# Patient Record
Sex: Male | Born: 1980 | Race: White | Hispanic: No | Marital: Married | Smoking: Current every day smoker
Health system: Southern US, Community
[De-identification: ages and names within clinical notes are randomized; demographics above are authoritative.]

## PROBLEM LIST (undated history)

## (undated) DIAGNOSIS — F209 Schizophrenia, unspecified: Secondary | ICD-10-CM

## (undated) DIAGNOSIS — R569 Unspecified convulsions: Secondary | ICD-10-CM

## (undated) DIAGNOSIS — F419 Anxiety disorder, unspecified: Secondary | ICD-10-CM

## (undated) DIAGNOSIS — F319 Bipolar disorder, unspecified: Secondary | ICD-10-CM

---

## 2005-02-13 ENCOUNTER — Emergency Department: Payer: Self-pay | Admitting: Emergency Medicine

## 2005-03-31 ENCOUNTER — Emergency Department: Payer: Self-pay | Admitting: Emergency Medicine

## 2005-09-26 ENCOUNTER — Emergency Department: Payer: Self-pay | Admitting: Emergency Medicine

## 2005-12-28 ENCOUNTER — Emergency Department: Payer: Self-pay | Admitting: Emergency Medicine

## 2006-05-28 ENCOUNTER — Emergency Department: Payer: Self-pay | Admitting: Emergency Medicine

## 2006-08-09 ENCOUNTER — Emergency Department: Payer: Self-pay | Admitting: Emergency Medicine

## 2007-11-14 ENCOUNTER — Emergency Department: Payer: Self-pay | Admitting: Emergency Medicine

## 2008-09-09 ENCOUNTER — Encounter: Payer: Self-pay | Admitting: Family Medicine

## 2008-09-22 ENCOUNTER — Encounter: Payer: Self-pay | Admitting: Family Medicine

## 2008-10-23 ENCOUNTER — Encounter: Payer: Self-pay | Admitting: Family Medicine

## 2008-11-23 ENCOUNTER — Encounter: Payer: Self-pay | Admitting: Family Medicine

## 2009-10-04 ENCOUNTER — Emergency Department: Payer: Self-pay | Admitting: Unknown Physician Specialty

## 2009-11-12 ENCOUNTER — Emergency Department: Payer: Self-pay | Admitting: Emergency Medicine

## 2012-03-09 IMAGING — CR DG CHEST 2V
1 series · 2 of 2 positions shown · non-contrast
Comparison: none

REASON FOR EXAM: difficulty breathing
COMMENTS:   May transport without cardiac monitor

PROCEDURE:     DXR - DXR CHEST PA (OR AP) AND LATERAL  - October 04, 2009 [DATE]
RESULT:     Comparison is made to a prior exam of 05/28/2006.
The lung fields are clear. The heart, mediastinal and osseous structures
show no significant abnormalities.

[Series 1: view not recorded · 0.17mm/px · 2 of 2 slices shown]
[im 1/2]
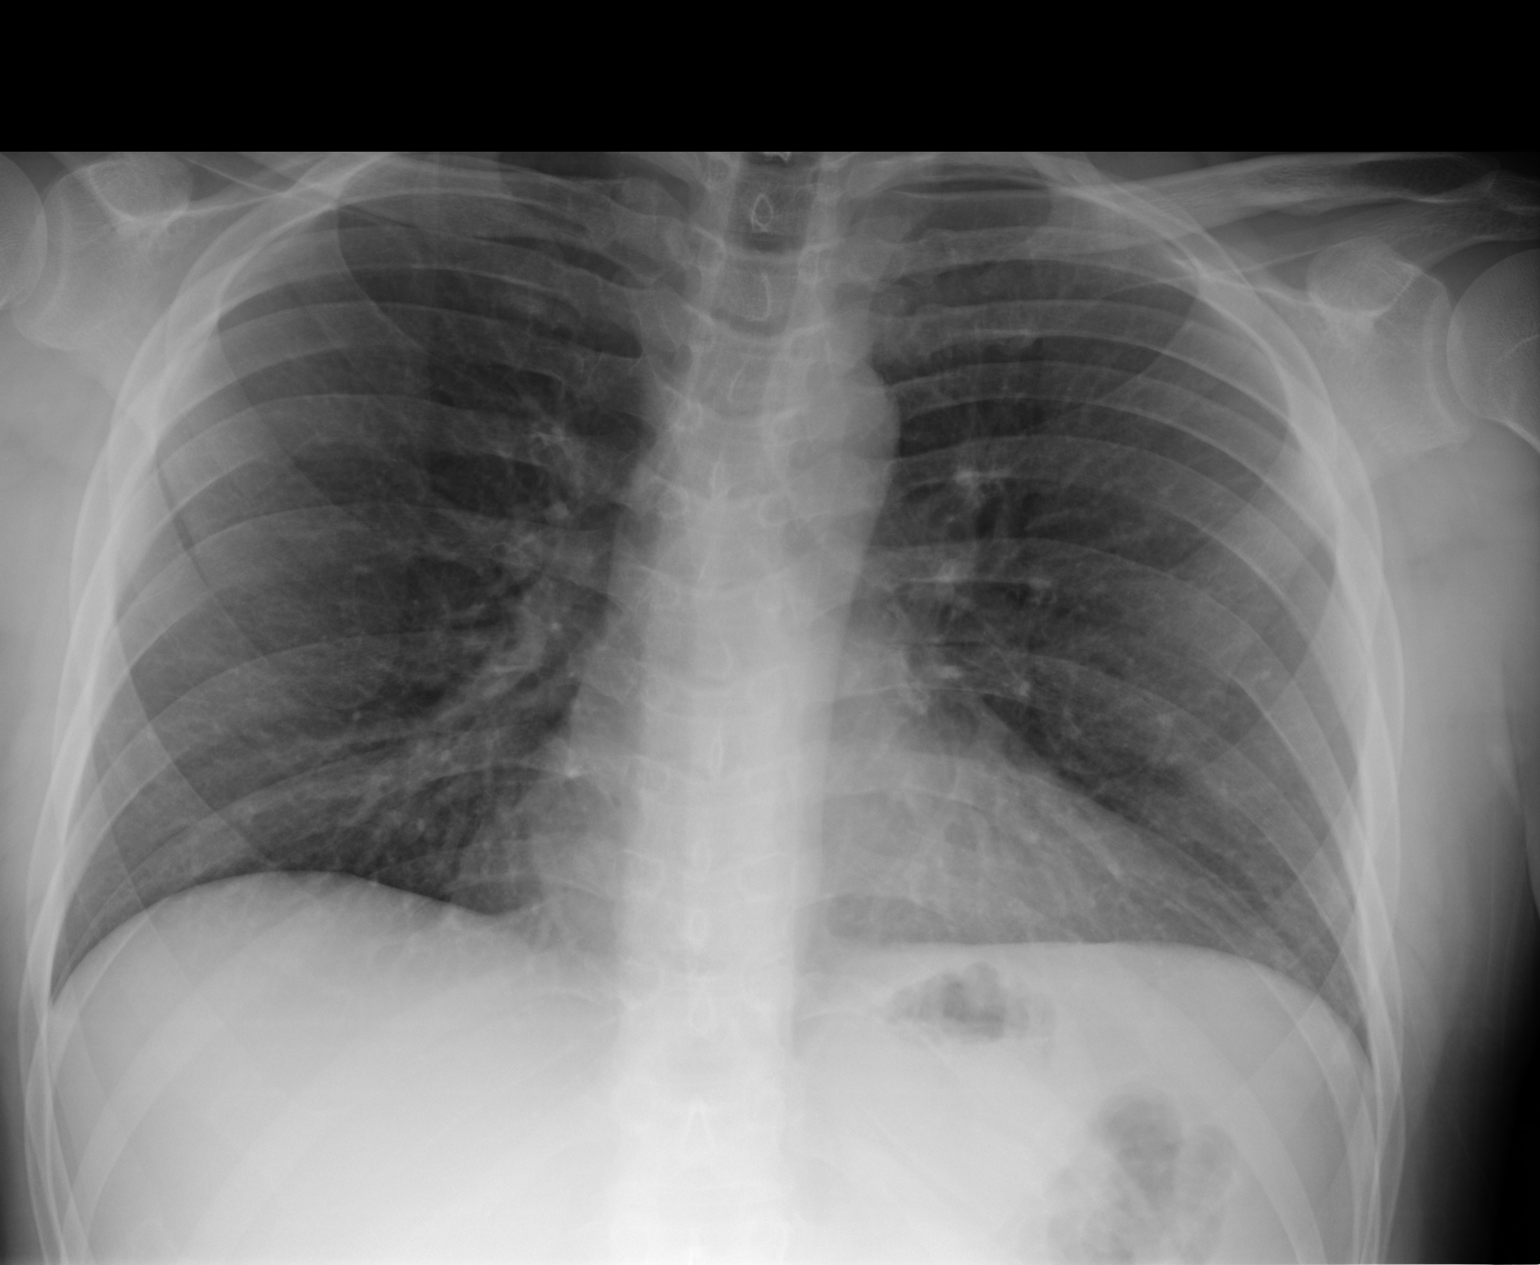
[im 2/2]
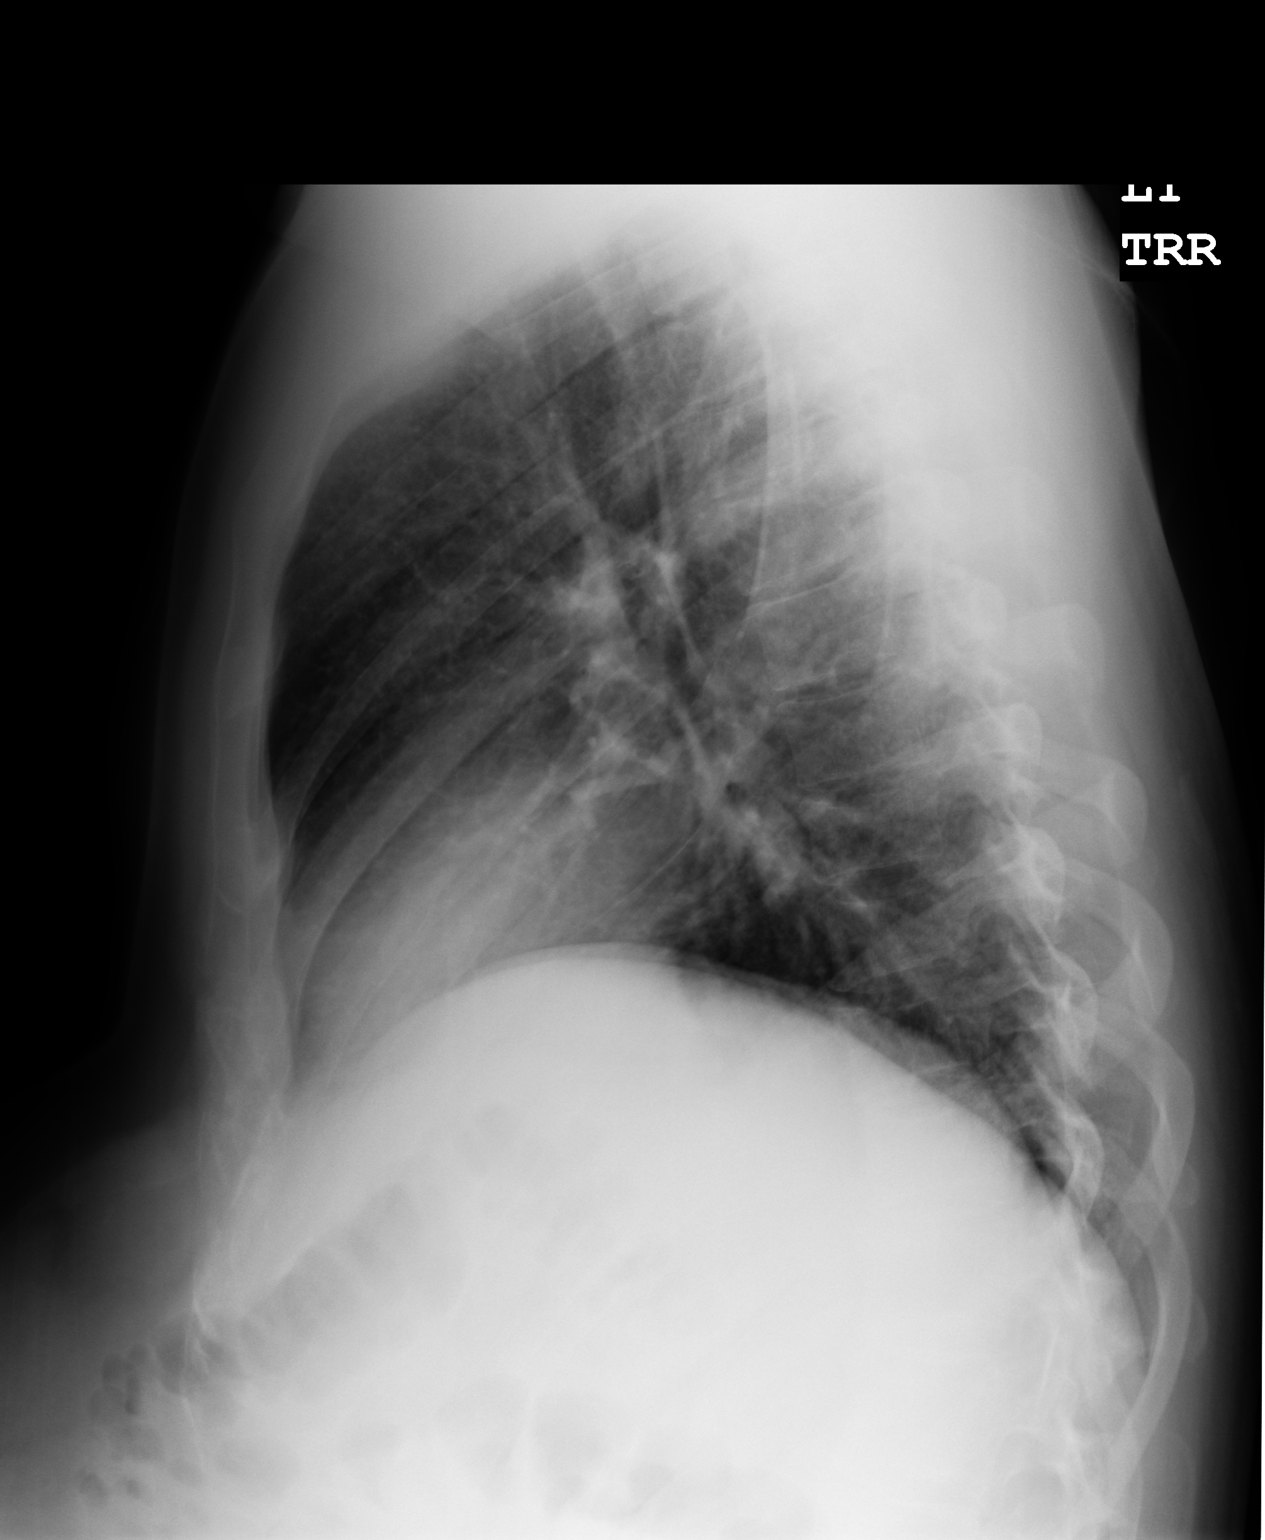

[2 of 2 positions shown; findings below may reference images not displayed]

IMPRESSION: No significant abnormalities are noted.

## 2014-11-03 ENCOUNTER — Emergency Department
Admission: EM | Admit: 2014-11-03 | Discharge: 2014-11-04 | Disposition: A | Discharge status: Home / Self Care | Payer: Self-pay | Attending: Emergency Medicine | Admitting: Emergency Medicine

## 2014-11-03 ENCOUNTER — Encounter: Payer: Self-pay | Admitting: Emergency Medicine

## 2014-11-03 ENCOUNTER — Other Ambulatory Visit: Payer: Self-pay

## 2014-11-03 DIAGNOSIS — Z79899 Other long term (current) drug therapy: Secondary | ICD-10-CM | POA: Insufficient documentation

## 2014-11-03 DIAGNOSIS — Z720 Tobacco use: Secondary | ICD-10-CM | POA: Insufficient documentation

## 2014-11-03 DIAGNOSIS — G40909 Epilepsy, unspecified, not intractable, without status epilepticus: Secondary | ICD-10-CM

## 2014-11-03 DIAGNOSIS — G40409 Other generalized epilepsy and epileptic syndromes, not intractable, without status epilepticus: Secondary | ICD-10-CM | POA: Insufficient documentation

## 2014-11-03 HISTORY — DX: Bipolar disorder, unspecified: F31.9

## 2014-11-03 HISTORY — DX: Unspecified convulsions: R56.9

## 2014-11-03 HISTORY — DX: Anxiety disorder, unspecified: F41.9

## 2014-11-03 HISTORY — DX: Schizophrenia, unspecified: F20.9

## 2014-11-03 LAB — COMPREHENSIVE METABOLIC PANEL
ALT: 35 U/L (ref 17–63)
AST: 41 U/L (ref 15–41)
Albumin: 4.3 g/dL (ref 3.5–5.0)
Alkaline Phosphatase: 71 U/L (ref 38–126)
Anion gap: 12 (ref 5–15)
BUN: 7 mg/dL (ref 6–20)
CO2: 22 mmol/L (ref 22–32)
Calcium: 9.1 mg/dL (ref 8.9–10.3)
Chloride: 105 mmol/L (ref 101–111)
Creatinine, Ser: 0.98 mg/dL (ref 0.61–1.24)
GFR calc Af Amer: >60 mL/min (ref >60)
GFR calc non Af Amer: >60 mL/min (ref >60)
GLUCOSE: 86 mg/dL (ref 65–99)
Potassium: 3.4 mmol/L — ABNORMAL LOW (ref 3.5–5.1)
SODIUM: 139 mmol/L (ref 135–145)
Total Bilirubin: 0.4 mg/dL (ref 0.3–1.2)
Total Protein: 7.3 g/dL (ref 6.5–8.1)

## 2014-11-03 LAB — CBC
HCT: 44.4 % (ref 40.0–52.0)
Hemoglobin: 15.4 g/dL (ref 13.0–18.0)
MCH: 31.8 pg (ref 26.0–34.0)
MCHC: 34.7 g/dL (ref 32.0–36.0)
MCV: 91.7 fL (ref 80.0–100.0)
Platelets: 198 10*3/uL (ref 150–440)
RBC: 4.84 MIL/uL (ref 4.40–5.90)
RDW: 14.6 % — ABNORMAL HIGH (ref 11.5–14.5)
WBC: 9.4 10*3/uL (ref 3.8–10.6)

## 2014-11-03 MED ORDER — LORAZEPAM 2 MG/ML IJ SOLN
1.0000 mg | Freq: Once | INTRAMUSCULAR | Status: AC
  Administered 2014-11-03: 1 mg via INTRAVENOUS
  Filled 2014-11-03: qty 1

## 2014-11-03 MED ORDER — LIDOCAINE VISCOUS 2 % MT SOLN
15.0000 mL | Freq: Once | OROMUCOSAL | Status: AC
  Administered 2014-11-03: 15 mL via OROMUCOSAL
  Filled 2014-11-03: qty 15

## 2014-11-03 NOTE — ED Provider Notes (Signed)
[facility] Emergency Department Provider Note  ____________________________________________  Time seen: 11:15 PM  I have reviewed the triage vital signs and the nursing notes.   HISTORY  Chief Complaint Seizures      HPI Robert Barker is a 34 y.o. male presents with witnessed generalized tonic-clonic seizure today that lasted proximally 5 minutes per family. Patient has a known seizure disorder for which she takes Lamictal 150 mg twice a day pain. Patient states that he has been compliant with his medications. Of note patient does admit to smoking marijuana approximately one week ago. Patient denies any recent illness no cough no fever no dysuria. Patient denies any sleep deprivation.     Past Medical History  Diagnosis Date  . Seizures   . Bipolar 1 disorder   . Schizophrenia   . Anxiety     There are no active problems to display for this patient.   History reviewed. No pertinent past surgical history.  Current Outpatient Rx  Name  Route  Sig  Dispense  Refill  . diazepam (VALIUM) 10 MG tablet   Oral   Take 10 mg by mouth every 8 (eight) hours as needed for anxiety.         . lamoTRIgine (LAMICTAL) 150 MG tablet   Oral   Take 150 mg by mouth 2 (two) times daily.         . QUEtiapine (SEROQUEL XR) 200 MG 24 hr tablet   Oral   Take 400 mg by mouth every evening.           Allergies Review of patient's allergies indicates no known allergies.  No family history on file.  Social History Social History  Substance Use Topics  . Smoking status: Current Every Day Smoker -- 0.50 packs/day    Types: Cigarettes  . Smokeless tobacco: None  . Alcohol Use: None    Review of Systems  Constitutional: Negative for fever. Eyes: Negative for visual changes. ENT: Negative for sore throat. Cardiovascular: Negative for chest pain. Respiratory: Negative for shortness of breath. Gastrointestinal: Negative for abdominal pain, vomiting and  diarrhea. Genitourinary: Negative for dysuria. Musculoskeletal: Negative for back pain. Skin: Negative for rash. Neurological: Negative for headaches, focal weakness or numbness. positive for seizure    10-point ROS otherwise negative.  ____________________________________________   PHYSICAL EXAM:  VITAL SIGNS: ED Triage Vitals  Enc Vitals Group     BP 11/03/14 2203 131/88 mmHg     Pulse Rate 11/03/14 2203 98     Resp 11/03/14 2203 14     Temp 11/03/14 2203 98.5 F (36.9 C)     Temp Source 11/03/14 2203 Oral     SpO2 11/03/14 2203 95 %     Weight 11/03/14 2203 185 lb (83.915 kg)     Height 11/03/14 2203  (1.905 m)     Head Cir --      Peak Flow --      Pain Score 11/03/14 2205 6     Pain Loc --      Pain Edu? --      Excl. in GC? --     Constitutional: Alert and oriented. Well appearing and in no distress. Eyes: Conjunctivae are normal. PERRL. Normal extraocular movements. ENT   Head: Normocephalic and atraumatic.   Nose: No congestion/rhinnorhea.   Mouth/Throat: Mucous membranes are moist. superficial laceration noted to the right side of the tongue.    Neck: No stridor. Cardiovascular: Normal rate, regular rhythm. Normal  and symmetric distal pulses are present in all extremities. No murmurs, rubs, or gallops. Respiratory: Normal respiratory effort without tachypnea nor retractions. Breath sounds are clear and equal bilaterally. No wheezes/rales/rhonchi. Gastrointestinal: Soft and nontender. No distention. There is no CVA tenderness. Genitourinary: deferred Musculoskeletal: Nontender with normal range of motion in all extremities. No joint effusions.  No lower extremity tenderness nor edema. Neurologic:  Normal speech and language. No gross focal neurologic deficits are appreciated. Speech is normal.  Skin:  Skin is warm, dry and intact. No rash noted. Psychiatric: Mood and affect are normal. Speech and behavior are normal. Patient exhibits  appropriate insight and judgment.  ____________________________________________    LABS (pertinent positives/negatives)    ____________________________________________   EKG  ED ECG REPORT I, BROWN, [place] N, the attending physician, personally viewed and interpreted this ECG.   Date: 11/04/2014  EKG Time: 9:57 PM  Rate: 98  Rhythm:  Normal Sinus rhythm  Axis: None  Intervals: Normal  ST&T Change: None    INITIAL IMPRESSION / ASSESSMENT AND PLAN / ED COURSE  Pertinent labs & imaging results that were available during my care of the patient were reviewed by me and considered in my medical decision making (see chart for details).  Patient received Ativan 1 mg IV and emergency department. Lab data revealed no gross abnormality. Patient advised to follow-up with neurologist tomorrow morning ____________________________________________   FINAL CLINICAL IMPRESSION(S) / ED DIAGNOSES  Final diagnoses:  Seizure disorder  Generalized tonic-clonic seizure      Darci Current, MD 11/04/14 [phone]

## 2014-11-03 NOTE — ED Notes (Signed)
Patient presents to the ED via EMS post seizure.  Patient's wife witnessed seizure and reported to EMS that the seizure lasted about 5 min.  Per EMS patient was post-ictal on arrival.  This is patient's third seizure this week.  Patient reports regularly taking medication that treats his seizure and his diagnosis of bipolar but patient cannot remember the name of the medication.  Patient denies missing any doses.  Patient reports recently smoking marijuana.  Patient denies alcohol use and denies lack of sleep.  Patient is in no obvious distress at this time.

## 2014-11-04 LAB — URINALYSIS COMPLETE WITH MICROSCOPIC (ARMC ONLY)
Bacteria, UA: NONE SEEN
Bilirubin Urine: NEGATIVE
Glucose, UA: NEGATIVE mg/dL
Ketones, ur: NEGATIVE mg/dL
Leukocytes, UA: NEGATIVE
NITRITE: NEGATIVE
Protein, ur: NEGATIVE mg/dL
Specific Gravity, Urine: 1.008 (ref 1.005–1.030)
Squamous Epithelial / LPF: NONE SEEN
pH: 6 (ref 5.0–8.0)

## 2014-11-04 MED ORDER — MAGIC MOUTHWASH W/LIDOCAINE: 5.0000 mL | Freq: Four times a day (QID) | ORAL | Status: AC | PRN

## 2014-11-04 NOTE — Discharge Instructions (Signed)

## 2016-10-29 ENCOUNTER — Emergency Department
Admission: EM | Admit: 2016-10-29 | Discharge: 2016-10-30 | Disposition: A | Discharge status: Home / Self Care | Payer: Self-pay | Attending: Emergency Medicine | Admitting: Emergency Medicine

## 2016-10-29 ENCOUNTER — Encounter: Payer: Self-pay | Admitting: Emergency Medicine

## 2016-10-29 DIAGNOSIS — F319 Bipolar disorder, unspecified: Secondary | ICD-10-CM

## 2016-10-29 DIAGNOSIS — R569 Unspecified convulsions: Secondary | ICD-10-CM

## 2016-10-29 DIAGNOSIS — R45851 Suicidal ideations: Secondary | ICD-10-CM | POA: Insufficient documentation

## 2016-10-29 DIAGNOSIS — F4325 Adjustment disorder with mixed disturbance of emotions and conduct: Secondary | ICD-10-CM | POA: Insufficient documentation

## 2016-10-29 LAB — CBC
HCT: 50.8 % (ref 40.0–52.0)
Hemoglobin: 17.6 g/dL (ref 13.0–18.0)
MCH: 32 pg (ref 26.0–34.0)
MCHC: 34.7 g/dL (ref 32.0–36.0)
MCV: 92.3 fL (ref 80.0–100.0)
PLATELETS: 263 10*3/uL (ref 150–440)
RBC: 5.5 MIL/uL (ref 4.40–5.90)
RDW: 14.9 % — ABNORMAL HIGH (ref 11.5–14.5)
WBC: 13.3 10*3/uL — AB (ref 3.8–10.6)

## 2016-10-29 LAB — COMPREHENSIVE METABOLIC PANEL
ALBUMIN: 5.1 g/dL — AB (ref 3.5–5.0)
ALK PHOS: 84 U/L (ref 38–126)
ALT: 33 U/L (ref 17–63)
ANION GAP: 14 (ref 5–15)
AST: 35 U/L (ref 15–41)
BUN: 10 mg/dL (ref 6–20)
CALCIUM: 9.9 mg/dL (ref 8.9–10.3)
CO2: 21 mmol/L — AB (ref 22–32)
Chloride: 104 mmol/L (ref 101–111)
Creatinine, Ser: 1.43 mg/dL — ABNORMAL HIGH (ref 0.61–1.24)
GFR calc Af Amer: >60 mL/min (ref >60)
GFR calc non Af Amer: >60 mL/min (ref >60)
GLUCOSE: 132 mg/dL — AB (ref 65–99)
POTASSIUM: 2.8 mmol/L — AB (ref 3.5–5.1)
SODIUM: 139 mmol/L (ref 135–145)
Total Bilirubin: 1 mg/dL (ref 0.3–1.2)
Total Protein: 8.3 g/dL — ABNORMAL HIGH (ref 6.5–8.1)

## 2016-10-29 LAB — URINE DRUG SCREEN, QUALITATIVE (ARMC ONLY)
Amphetamines, Ur Screen: NOT DETECTED
BARBITURATES, UR SCREEN: NOT DETECTED
Benzodiazepine, Ur Scrn: POSITIVE — AB
CANNABINOID 50 NG, UR ~~LOC~~: NOT DETECTED
Cocaine Metabolite,Ur ~~LOC~~: NOT DETECTED
MDMA (Ecstasy)Ur Screen: NOT DETECTED
Methadone Scn, Ur: NOT DETECTED
Opiate, Ur Screen: NOT DETECTED
Phencyclidine (PCP) Ur S: NOT DETECTED
TRICYCLIC, UR SCREEN: NOT DETECTED

## 2016-10-29 LAB — ETHANOL: Alcohol, Ethyl (B): <5 mg/dL (ref <5)

## 2016-10-29 LAB — SALICYLATE LEVEL

## 2016-10-29 LAB — ACETAMINOPHEN LEVEL: Acetaminophen (Tylenol), Serum: <10 ug/mL — ABNORMAL LOW (ref 10–30)

## 2016-10-29 MED ORDER — LAMOTRIGINE 25 MG PO TABS
150.0000 mg | ORAL_TABLET | Freq: Two times a day (BID) | ORAL | Status: DC
  Administered 2016-10-29 – 2016-10-30 (×2): 150 mg via ORAL
  Filled 2016-10-29: qty 2

## 2016-10-29 MED ORDER — POTASSIUM CHLORIDE 20 MEQ/15ML (10%) PO SOLN
40.0000 meq | Freq: Once | ORAL | Status: AC
  Administered 2016-10-29: 40 meq via ORAL
  Filled 2016-10-29: qty 30

## 2016-10-29 MED ORDER — DIAZEPAM 5 MG PO TABS: 10.0000 mg | ORAL_TABLET | Freq: Three times a day (TID) | ORAL | Status: DC | PRN

## 2016-10-29 MED ORDER — MAGIC MOUTHWASH W/LIDOCAINE
5.0000 mL | Freq: Four times a day (QID) | ORAL | Status: DC | PRN
  Filled 2016-10-29: qty 5

## 2016-10-29 MED ORDER — MAGIC MOUTHWASH
5.0000 mL | Freq: Four times a day (QID) | ORAL | Status: DC | PRN
  Filled 2016-10-29: qty 5

## 2016-10-29 MED ORDER — QUETIAPINE FUMARATE ER 200 MG PO TB24
400.0000 mg | ORAL_TABLET | Freq: Every evening | ORAL | Status: DC
  Administered 2016-10-29: 400 mg via ORAL
  Filled 2016-10-29: qty 2
  Filled 2016-10-29: qty 1

## 2016-10-29 MED ORDER — LIDOCAINE VISCOUS 2 % MT SOLN
5.0000 mL | Freq: Four times a day (QID) | OROMUCOSAL | Status: DC | PRN
  Filled 2016-10-29: qty 15

## 2016-10-29 NOTE — ED Triage Notes (Signed)
First Nurse Note:  Arrives via [facility].  Patient c/o SI.  Patient "thrown out of house today" and was on the side of the road c/o feeling SI.  Has history of Depression, Bipolar, Schizophrenia and manic episodes.  Patient AAOx3.  Skin warm and dry.  Calm and cooperative.

## 2016-10-29 NOTE — ED Notes (Signed)

## 2016-10-29 NOTE — ED Provider Notes (Signed)
[facility] Emergency Department Provider Note   ____________________________________________   First MD Initiated Contact with Patient 10/29/16 1806     (approximate)  I have reviewed the triage vital signs and the nursing notes.   HISTORY  Chief Complaint Suicidal and Homicidal    HPI Robert Barker is a 36 y.o. male patient says he's been out of his meds for 2 days. He says his emotions are normal over the place sometimes is crying He is laughing and vice versa. He told the nurse that he had suicidal ideation and wanted to hurt people. He does not have a plan for either.   Past Medical History:  Diagnosis Date  . Anxiety   . Bipolar 1 disorder (HCC)   . Schizophrenia (HCC)   . Seizures (HCC)     There are no active problems to display for this patient.   History reviewed. No pertinent surgical history.  Prior to Admission medications   Medication Sig Start Date End Date Taking? Authorizing Provider  Citalopram Hydrobromide (CELEXA PO) Take 1 tablet by mouth 2 (two) times daily.   Yes [provider]  diazepam (VALIUM) 10 MG tablet Take 10 mg by mouth every 8 (eight) hours as needed for anxiety.   Yes [provider]  lamoTRIgine (LAMICTAL) 150 MG tablet Take 150 mg by mouth 2 (two) times daily.   Yes [provider]  QUEtiapine (SEROQUEL XR) 200 MG 24 hr tablet Take 400 mg by mouth every evening.   Yes [provider]  Alum & Mag Hydroxide-Simeth (MAGIC MOUTHWASH W/LIDOCAINE) SOLN Take 5 mLs by mouth 4 (four) times daily as needed for mouth pain. Patient not taking: Reported on 10/29/2016 11/04/14   Darci Current, MD    Allergies Patient has no known allergies.  No family history on file.  Social History Social History  Substance Use Topics  . Smoking status: Current Every Day Smoker    Packs/day: 0.50    Types: Cigarettes  . Smokeless tobacco: Never Used  . Alcohol use No    Review of  Systems  Constitutional: No fever/chills Eyes: No visual changes. ENT: No sore throat. Cardiovascular: Denies chest pain. Respiratory: Denies shortness of breath. Gastrointestinal: No abdominal pain.  No nausea, no vomiting.  No diarrhea.  No constipation. Genitourinary: Negative for dysuria. Musculoskeletal: Negative for back pain. Skin: Negative for rash. Neurological: Negative for headaches, focal weakness or numbness.   ____________________________________________   PHYSICAL EXAM:  VITAL SIGNS: ED Triage Vitals  Enc Vitals Group     BP 10/29/16 1742 119/80     Pulse Rate 10/29/16 1742 (!) 105     Resp 10/29/16 1742 18     Temp 10/29/16 1742 98.5 F (36.9 C)     Temp Source 10/29/16 1742 Oral     SpO2 10/29/16 1742 97 %     Weight 10/29/16 1742 240 lb (108.9 kg)     Height 10/29/16 1742 6\' 5"  (1.956 m)     Head Circumference --      Peak Flow --      Pain Score 10/29/16 1805 2     Pain Loc --      Pain Edu? --      Excl. in GC? --     Constitutional: Alert and oriented. Well appearing and in no acute distress. Eyes: Conjunctivae are normal.  Head: Atraumatic. Nose: No congestion/rhinnorhea. Mouth/Throat: Mucous membranes are moist.  Oropharynx non-erythematous. Neck: No stridor.   Cardiovascular:  Normal rate, regular rhythm. Grossly normal heart sounds.  Good peripheral circulation. Respiratory: Normal respiratory effort.  No retractions. Lungs CTAB. Gastrointestinal: Soft and nontender. No distention. No abdominal bruits. No CVA tenderness. }Musculoskeletal: No lower extremity tenderness nor edema.  No joint effusions. Neurologic:  Normal speech and language. No gross focal neurologic deficits are appreciated. No gait instability. Skin:  Skin is warm, dry and intact. No rash noted. Psychiatric: Mood and affect are normal. Speech and behavior are normal.  ____________________________________________   LABS (all labs ordered are listed, but only abnormal  results are displayed)  Labs Reviewed  COMPREHENSIVE METABOLIC PANEL - Abnormal; Notable for the following:       Result Value   Potassium 2.8 (*)    CO2 21 (*)    Glucose, Bld 132 (*)    Creatinine, Ser 1.43 (*)    Total Protein 8.3 (*)    Albumin 5.1 (*)    All other components within normal limits  ACETAMINOPHEN LEVEL - Abnormal; Notable for the following:    Acetaminophen (Tylenol), Serum <10 (*)    All other components within normal limits  CBC - Abnormal; Notable for the following:    WBC 13.3 (*)    RDW 14.9 (*)    All other components within normal limits  URINE DRUG SCREEN, QUALITATIVE ([facility] ONLY) - Abnormal; Notable for the following:    Benzodiazepine, Ur Scrn POSITIVE (*)    All other components within normal limits  ETHANOL  SALICYLATE LEVEL   ____________________________________________  EKG   ____________________________________________  RADIOLOGY   ____________________________________________   PROCEDURES  Procedure(s) performed:  Procedures  Critical Care performed:   ____________________________________________   INITIAL IMPRESSION / ASSESSMENT AND PLAN / ED COURSE  Pertinent labs & imaging results that were available during my care of the patient were reviewed by me and considered in my medical decision making (see chart for details).        ____________________________________________   FINAL CLINICAL IMPRESSION(S) / ED DIAGNOSES  Final diagnoses:  Suicidal ideation      NEW MEDICATIONS STARTED DURING THIS VISIT:  New Prescriptions   No medications on file     Note:  This document was prepared using Dragon voice recognition software and may include unintentional dictation errors.    Arnaldo NatalMalinda, Paul F, MD 10/29/16 [phone]

## 2016-10-29 NOTE — ED Notes (Signed)
Patients wife will take belongings home. [name] [phone].

## 2016-10-29 NOTE — ED Triage Notes (Signed)
Patient presents to ED via POV from home with SI and HI. Patient is here voluntarily. Patient states, "I've been out of all my medication for 2 days". Patient expresses SI with no plan. Patient expressed HI. Patient states, "I just want to hurt everyone. I am in range mode". Patient cooperative in triage, no aggressive behavior noted.

## 2016-10-29 NOTE — ED Notes (Signed)
Introduced self to pt. Pt given warm blanket.  

## 2016-10-29 NOTE — BH Assessment (Signed)
Assessment Note  Robert Barker is an 36 y.o. male presenting to the ED, voluntarily, after being picked up by [facility].  Patient reportedly has been kicked out of his house and was found walking in the road.  Patient endorses suicidal ideations without intent.  Patient also reports that his mood goes up and down.  He states he is happy one minute and then, wants to "knock someone's head off" the next minute.  He denies wanting to harm anyone. He states that he is going through a difficult time with his wife but did not elaborate on what exactly is going on in the relationship.    Pt currently denies SI/HI while in the ED.  He denies any auditory/visual hallucinations.  He denies current drug use.  He reports he currently sees Dr. Marguerite Olea at [facility] for medication management.  He states he has not been able to attend his appointments due to transportation issues.  Diagnosis: Anxiety, Bipolar Disorder  Past Medical History:  Past Medical History:  Diagnosis Date  . Anxiety   . Bipolar 1 disorder (HCC)   . Schizophrenia (HCC)   . Seizures (HCC)     History reviewed. No pertinent surgical history.  Family History: No family history on file.  Social History:  reports that he has been smoking Cigarettes.  He has been smoking about 0.50 packs per day. He has never used smokeless tobacco. He reports that he does not drink alcohol or use drugs.  Additional Social History:  Alcohol / Drug Use Pain Medications: See PTA Prescriptions: See PTA Over the Counter: See PTA History of alcohol / drug use?: No history of alcohol / drug abuse (Pt denies drug/alcohol use)  CIWA: CIWA-Ar BP: 119/80 Pulse Rate: (!) 105 COWS:    Allergies: No Known Allergies  Home Medications:  (Not in a hospital admission)  OB/GYN Status:  No LMP for male patient.  General Assessment Data Location of Assessment: [facility] ED TTS Assessment: In system Is this a Tele or Face-to-Face Assessment?: Face-to-Face Is this  an Initial Assessment or a Re-assessment for this encounter?: Initial Assessment Marital status: Married [place] name: n/a Is patient pregnant?: Other (Comment) (Pt is male) Pregnancy Status: Other (Comment) (Pt is a male) Living Arrangements: Spouse/significant other Can pt return to current living arrangement?: Yes Admission Status: Voluntary Is patient capable of signing voluntary admission?: Yes Referral Source: Self/Family/Friend Insurance type: Medicaid     Crisis Care Plan Living Arrangements: Spouse/significant other Legal Guardian: Other: (self) Name of Psychiatrist: Dr. Marguerite Olea Name of Therapist: [facility]  Education Status Is patient currently in school?: No Current Grade: na Highest grade of school patient has completed: 12 Name of school: na Contact person: na  Risk to self with the past 6 months Suicidal Ideation: Yes-Currently Present Has patient been a risk to self within the past 6 months prior to admission? : No Suicidal Intent: No Has patient had any suicidal intent within the past 6 months prior to admission? : No Is patient at risk for suicide?: No Suicidal Plan?: No Has patient had any suicidal plan within the past 6 months prior to admission? : No Access to Means: No What has been your use of drugs/alcohol within the last 12 months?: Pt denies drug/alcohol use Previous Attempts/Gestures: No How many times?: 0 Other Self Harm Risks: none identified Triggers for Past Attempts: None known Intentional Self Injurious Behavior: None Family Suicide History: No Recent stressful life event(s): Conflict (Comment), Financial Problems (conflict with wife) Persecutory  voices/beliefs?: No Depression: Yes Depression Symptoms: Insomnia, Loss of interest in usual pleasures, Feeling worthless/self pity, Feeling angry/irritable Substance abuse history and/or treatment for substance abuse?: No Suicide prevention information given to non-admitted patients: Not  applicable  Risk to Others within the past 6 months Homicidal Ideation: Yes-Currently Present Does patient have any lifetime risk of violence toward others beyond the six months prior to admission? : No Thoughts of Harm to Others: Yes-Currently Present Comment - Thoughts of Harm to Others: Pt reports wanting to hit other people messing with him. Current Homicidal Intent: No Current Homicidal Plan: No Access to Homicidal Means: No Identified Victim: none identified History of harm to others?: No Assessment of Violence: None Noted Violent Behavior Description: None identified Does patient have access to weapons?: No Criminal Charges Pending?: No Does patient have a court date: No Is patient on probation?: No  Psychosis Hallucinations: None noted Delusions: None noted  Mental Status Report Appearance/Hygiene: In scrubs Eye Contact: Good Motor Activity: Freedom of movement, Tremors Speech: Logical/coherent Level of Consciousness: Alert Mood: Depressed, Sad, Anxious Affect: Anxious, Depressed, Sad Anxiety Level: Moderate Thought Processes: Relevant, Coherent Judgement: Partial Orientation: Person, Place, Time, Situation Obsessive Compulsive Thoughts/Behaviors: Minimal  Cognitive Functioning Concentration: Normal Memory: Recent Intact, Remote Intact IQ: Average Insight: Fair Impulse Control: Fair Appetite: Poor Weight Loss: 25 Weight Gain: 0 Sleep: Decreased Total Hours of Sleep: 5 Vegetative Symptoms: None  ADLScreening [facility] Assessment Services) Patient's cognitive ability adequate to safely complete daily activities?: Yes Patient able to express need for assistance with ADLs?: Yes Independently performs ADLs?: Yes (appropriate for developmental age)  Prior Inpatient Therapy Prior Inpatient Therapy: No Prior Therapy Dates: n/a Prior Therapy Facilty/Provider(s): n/a Reason for Treatment: n/a  Prior Outpatient Therapy Prior Outpatient Therapy: Yes Prior Therapy  Dates: current Prior Therapy Facilty/Provider(s): n/a Reason for Treatment: n/a Does patient have an ACCT team?: No Does patient have Intensive In-House Services?  : No Does patient have [facility] services? : No Does patient have P4CC services?: No  ADL Screening (condition at time of admission) Patient's cognitive ability adequate to safely complete daily activities?: Yes Patient able to express need for assistance with ADLs?: Yes Independently performs ADLs?: Yes (appropriate for developmental age)       Abuse/Neglect Assessment (Assessment to be complete while patient is alone) Physical Abuse: Denies Verbal Abuse: Denies Sexual Abuse: Denies Exploitation of patient/patient's resources: Denies Self-Neglect: Denies Values / Beliefs Cultural Requests During Hospitalization: None Spiritual Requests During Hospitalization: None Consults Spiritual Care Consult Needed: No Social Work Consult Needed: No Merchant navy officerAdvance Directives (For Healthcare) Does Patient Have a Medical Advance Directive?: No Would patient like information on creating a medical advance directive?: No - Patient declined    Additional Information 1:1 In Past 12 Months?: No CIRT Risk: No Elopement Risk: No Does patient have medical clearance?: Yes     Disposition:  Disposition Initial Assessment Completed for this Encounter: Yes Disposition of Patient: Other dispositions Other disposition(s): Other (Comment) (Pending Psych MD consult)  On Site Evaluation by:   Reviewed with Physician:    Artist Beachoxana C Edin Kon 10/29/2016 9:36 PM

## 2016-10-29 NOTE — ED Notes (Signed)
Drink provided while we await meal tray to arrive   Pt observed lying in bed - watching TV   Pt visualized with NAD  No verbalized needs or concerns at this time  Continue to monitor

## 2016-10-30 DIAGNOSIS — F319 Bipolar disorder, unspecified: Secondary | ICD-10-CM

## 2016-10-30 DIAGNOSIS — F4325 Adjustment disorder with mixed disturbance of emotions and conduct: Secondary | ICD-10-CM

## 2016-10-30 DIAGNOSIS — R569 Unspecified convulsions: Secondary | ICD-10-CM

## 2016-10-30 NOTE — Consult Note (Signed)
[place] [facility] Consult   Reason for Consult:  Consult for 36 year old man with a history of mood instability and seizure disorder who came voluntarily to the emergency room Referring Physician:  Jimmye Norman Patient Identification: [facility] MRN:  [id number] Principal Diagnosis: Adjustment disorder with mixed disturbance of emotions and conduct Diagnosis:   Patient Active Problem List   Diagnosis Date Noted  . Bipolar 1 disorder ([place]) [F31.9] 10/30/2016  . Seizures ([place]) [R56.9] 10/30/2016  . Adjustment disorder with mixed disturbance of emotions and conduct [F43.25] 10/30/2016    Total Time spent with patient: 1 hour  Subjective:   Robert Barker is a 36 y.o. male patient admitted with "yesterday I felt like I might just hurt someone".  HPI:  Patient has chronic mood problems but says that for the past 10 days he has been feeling significantly worse. He and his wife are having arguments frequently and he is feeling very stressed out about it. Yesterday he had another argument with her that escalated to where he felt like he was going to explode. He did not actually do anything to hurt himself or hurt her. On interview today he has no intention or plan of hurting himself or hurting her or anyone else. He says that his mood is been feeling more anxious and irritable recently. Sleep has been a little bit worse. He denies that he's been having any auditory or visual hallucinations. Does not report a psychotic symptoms. Denies that he is abusing alcohol or drugs. He currently takes a combination of lamotrigine Seroquel Valium and possibly an antidepressant for control of both his mood disorder and his seizure disorder.  Social history: He is married says he lives with his wife and her girlfriend. The patient does not work outside the home he is disabled by his seizures. Has pretty limited social contact.  Medical history: History of partial seizures. Sees a neurologist through  [facility].  Substance abuse history: He says there were times more than 10 years ago when he used alcohol and drugs but has been off of them for at least that long.  Past Psychiatric History: Denies having any past history of psychiatric hospitalization. Denies ever being suicidal in the past. He says he has been involved in fights in the past. He denies having any history of being violent to his wife or having any wish to be violent at this time. He says he's been on lots of other psychiatric medicines in the past but the current ones have been stable for a while. He tells me wants to be on some kind of new medicine that will make him more stable but admits that nothing in the past has been any better than what he is taking now.  Risk to Self: Suicidal Ideation: Yes-Currently Present Suicidal Intent: No Is patient at risk for suicide?: No Suicidal Plan?: No Access to Means: No What has been your use of drugs/alcohol within the last 12 months?: Pt denies drug/alcohol use How many times?: 0 Other Self Harm Risks: none identified Triggers for Past Attempts: None known Intentional Self Injurious Behavior: None Risk to Others: Homicidal Ideation: Yes-Currently Present Thoughts of Harm to Others: Yes-Currently Present Comment - Thoughts of Harm to Others: Pt reports wanting to hit other people messing with him. Current Homicidal Intent: No Current Homicidal Plan: No Access to Homicidal Means: No Identified Victim: none identified History of harm to others?: No Assessment of Violence: None Noted Violent Behavior Description: None identified Does patient have access  to weapons?: No Criminal Charges Pending?: No Does patient have a court date: No Prior Inpatient Therapy: Prior Inpatient Therapy: No Prior Therapy Dates: n/a Prior Therapy Facilty/Provider(s): n/a Reason for Treatment: n/a Prior Outpatient Therapy: Prior Outpatient Therapy: Yes Prior Therapy Dates: current Prior Therapy  Facilty/Provider(s): n/a Reason for Treatment: n/a Does patient have an ACCT team?: No Does patient have Intensive In-House Services?  : No Does patient have [facility] services? : No Does patient have P4CC services?: No  Past Medical History:  Past Medical History:  Diagnosis Date  . Anxiety   . Bipolar 1 disorder ([place])   . Schizophrenia ([place])   . Seizures ([place])    History reviewed. No pertinent surgical history. Family History: No family history on file. Family Psychiatric  History: He says his father had mental illness by his assessment but was never treated Social History:  History  Alcohol Use No     History  Drug Use No    Social History   Social History  . Marital status: Married    Spouse name: N/A  . Number of children: N/A  . Years of education: N/A   Social History Main Topics  . Smoking status: Current Every Day Smoker    Packs/day: 0.50    Types: Cigarettes  . Smokeless tobacco: Never Used  . Alcohol use No  . Drug use: No  . Sexual activity: Not Asked   Other Topics Concern  . None   Social History Narrative  . None   Additional Social History:    Allergies:  No Known Allergies  Labs:  Results for orders placed or performed during the hospital encounter of 10/29/16 (from the past 48 hour(s))  Comprehensive metabolic panel     Status: Abnormal   Collection Time: 10/29/16  5:41 PM  Result Value Ref Range   Sodium 139 135 - 145 mmol/L   Potassium 2.8 (L) 3.5 - 5.1 mmol/L   Chloride 104 101 - 111 mmol/L   CO2 21 (L) 22 - 32 mmol/L   Glucose, Bld 132 (H) 65 - 99 mg/dL   BUN 10 6 - 20 mg/dL   Creatinine, Ser 1.43 (H) 0.61 - 1.24 mg/dL   Calcium 9.9 8.9 - 10.3 mg/dL   Total Protein 8.3 (H) 6.5 - 8.1 g/dL   Albumin 5.1 (H) 3.5 - 5.0 g/dL   AST 35 15 - 41 U/L   ALT 33 17 - 63 U/L   Alkaline Phosphatase 84 38 - 126 U/L   Total Bilirubin 1.0 0.3 - 1.2 mg/dL   GFR calc non Af Amer >60 >60 mL/min   GFR calc Af Amer >60 >60 mL/min    Comment:  (NOTE) The eGFR has been calculated using the CKD EPI equation. This calculation has not been validated in all clinical situations. eGFR's persistently <60 mL/min signify possible Chronic Kidney Disease.    Anion gap 14 5 - 15  Ethanol     Status: None   Collection Time: 10/29/16  5:41 PM  Result Value Ref Range   Alcohol, Ethyl (B) <5 <5 mg/dL    Comment:        LOWEST DETECTABLE LIMIT FOR SERUM ALCOHOL IS 5 mg/dL FOR MEDICAL PURPOSES ONLY   Salicylate level     Status: None   Collection Time: 10/29/16  5:41 PM  Result Value Ref Range   Salicylate Lvl <2.7 2.8 - 30.0 mg/dL  Acetaminophen level     Status: Abnormal   Collection Time: 10/29/16  5:41 PM  Result Value Ref Range   Acetaminophen (Tylenol), Serum <10 (L) 10 - 30 ug/mL    Comment:        THERAPEUTIC CONCENTRATIONS VARY SIGNIFICANTLY. A RANGE OF 10-30 ug/mL MAY BE AN EFFECTIVE CONCENTRATION FOR MANY PATIENTS. HOWEVER, SOME ARE BEST TREATED AT CONCENTRATIONS OUTSIDE THIS RANGE. ACETAMINOPHEN CONCENTRATIONS >150 ug/mL AT 4 HOURS AFTER INGESTION AND >50 ug/mL AT 12 HOURS AFTER INGESTION ARE OFTEN ASSOCIATED WITH TOXIC REACTIONS.   cbc     Status: Abnormal   Collection Time: 10/29/16  5:41 PM  Result Value Ref Range   WBC 13.3 (H) 3.8 - 10.6 K/uL   RBC 5.50 4.40 - 5.90 MIL/uL   Hemoglobin 17.6 13.0 - 18.0 g/dL   HCT 50.8 40.0 - 52.0 %   MCV 92.3 80.0 - 100.0 fL   MCH 32.0 26.0 - 34.0 pg   MCHC 34.7 32.0 - 36.0 g/dL   RDW 14.9 (H) 11.5 - 14.5 %   Platelets 263 150 - 440 K/uL  Urine Drug Screen, Qualitative     Status: Abnormal   Collection Time: 10/29/16  5:41 PM  Result Value Ref Range   Tricyclic, Ur Screen NONE DETECTED NONE DETECTED   Amphetamines, Ur Screen NONE DETECTED NONE DETECTED   MDMA (Ecstasy)Ur Screen NONE DETECTED NONE DETECTED   Cocaine Metabolite,Ur [place] NONE DETECTED NONE DETECTED   Opiate, Ur Screen NONE DETECTED NONE DETECTED   Phencyclidine (PCP) Ur S NONE DETECTED NONE DETECTED    Cannabinoid 50 Ng, Ur [place] NONE DETECTED NONE DETECTED   Barbiturates, Ur Screen NONE DETECTED NONE DETECTED   Benzodiazepine, Ur Scrn POSITIVE (A) NONE DETECTED   Methadone Scn, Ur NONE DETECTED NONE DETECTED    Comment: (NOTE) 762  Tricyclics, urine               Cutoff 1000 ng/mL 200  Amphetamines, urine             Cutoff 1000 ng/mL 300  MDMA (Ecstasy), urine           Cutoff 500 ng/mL 400  Cocaine Metabolite, urine       Cutoff 300 ng/mL 500  Opiate, urine                   Cutoff 300 ng/mL 600  Phencyclidine (PCP), urine      Cutoff 25 ng/mL 700  Cannabinoid, urine              Cutoff 50 ng/mL 800  Barbiturates, urine             Cutoff 200 ng/mL 900  Benzodiazepine, urine           Cutoff 200 ng/mL 1000 Methadone, urine                Cutoff 300 ng/mL 1100 1200 The urine drug screen provides only a preliminary, unconfirmed 1300 analytical test result and should not be used for non-medical 1400 purposes. Clinical consideration and professional judgment should 1500 be applied to any positive drug screen result due to possible 1600 interfering substances. A more specific alternate chemical method 1700 must be used in order to obtain a confirmed analytical result.  1800 Gas chromato graphy / mass spectrometry (GC/MS) is the preferred 1900 confirmatory method.     Current Facility-Administered Medications  Medication Dose Route Frequency Provider Last Rate Last Dose  . diazepam (VALIUM) tablet 10 mg  10 mg Oral Q8H PRN Nena Polio, MD      .  lamoTRIgine (LAMICTAL) tablet 150 mg  150 mg Oral BID Nena Polio, MD   150 mg at 10/30/16 1010  . magic mouthwash  5 mL Oral QID PRN Nena Polio, MD       And  . lidocaine (XYLOCAINE) 2 % viscous mouth solution 5 mL  5 mL Mouth/Throat Q6H PRN Nena Polio, MD      . QUEtiapine (SEROQUEL XR) 24 hr tablet 400 mg  400 mg Oral QPM Nena Polio, MD   400 mg at 10/29/16 2040   Current Outpatient Prescriptions  Medication Sig  Dispense Refill  . Citalopram Hydrobromide (CELEXA PO) Take 1 tablet by mouth 2 (two) times daily.    . diazepam (VALIUM) 10 MG tablet Take 10 mg by mouth every 8 (eight) hours as needed for anxiety.    . lamoTRIgine (LAMICTAL) 150 MG tablet Take 150 mg by mouth 2 (two) times daily.    . QUEtiapine (SEROQUEL XR) 200 MG 24 hr tablet Take 400 mg by mouth every evening.    . Alum & Mag Hydroxide-Simeth (MAGIC MOUTHWASH W/LIDOCAINE) SOLN Take 5 mLs by mouth 4 (four) times daily as needed for mouth pain. (Patient not taking: Reported on 10/29/2016) 30 mL 0    Musculoskeletal: Strength & Muscle Tone: within normal limits Gait & Station: normal Patient leans: N/A  Psychiatric Specialty Exam: Physical Exam  Nursing note and vitals reviewed. Constitutional: He appears well-developed and well-nourished.  HENT:  Head: Normocephalic and atraumatic.  Eyes: Pupils are equal, round, and reactive to light. Conjunctivae are normal.  Neck: Normal range of motion.  Cardiovascular: Regular rhythm and normal heart sounds.   Respiratory: Effort normal. No respiratory distress.  GI: Soft.  Musculoskeletal: Normal range of motion.  Neurological: He is alert.  Skin: Skin is warm and dry.  Psychiatric: His speech is normal and behavior is normal. Judgment normal. His mood appears anxious. Thought content is not paranoid. Cognition and memory are normal. He expresses no homicidal and no suicidal ideation.    Review of Systems  Constitutional: Negative.   HENT: Negative.   Eyes: Negative.   Respiratory: Negative.   Cardiovascular: Negative.   Gastrointestinal: Negative.   Musculoskeletal: Negative.   Skin: Negative.   Neurological: Negative.   Psychiatric/Behavioral: Positive for depression. Negative for hallucinations, memory loss, substance abuse and suicidal ideas. The patient is nervous/anxious. The patient does not have insomnia.     Blood pressure 127/72, pulse 100, temperature 98.8 F (37.1 C),  temperature source Oral, resp. rate 18, height _0  (1.956 m), weight 108.9 kg (240 lb), SpO2 95 %.Body mass index is 28.46 kg/m.  General Appearance: Casual  Eye Contact:  Fair  Speech:  Slow  Volume:  Decreased  Mood:  Dysphoric  Affect:  Constricted  Thought Process:  Goal Directed  Orientation:  Full (Time, Place, and Person)  Thought Content:  Logical  Suicidal Thoughts:  No  Homicidal Thoughts:  No  Memory:  Immediate;   Good Recent;   Fair Remote;   Fair  Judgement:  Fair  Insight:  Fair  Psychomotor Activity:  Normal  Concentration:  Concentration: Fair  Recall:  [employer] of Knowledge:  Fair  Language:  Fair  Akathisia:  No  Handed:  Right  AIMS (if indicated):     Assets:  Desire for Improvement Housing Resilience Social Support  ADL's:  Intact  Cognition:  WNL  Sleep:        Treatment Plan Summary: Plan 36 year old  man came to the emergency room voluntarily. He says that he really just wanted to talk to someone. He has been going through a lot of extra stress recently and his mood is been more irritable but he does not report any psychotic symptoms and does not report any actual thought of hurting himself or anyone else. He seems a little withdrawn but has been cooperative here in the emergency room. I offered him the option of admission to the hospital if he wanted to see if medicines could be adjusted. Patient declined saying that he just wants to go home and follow-up with his outpatient doctor. He does not meet commitment criteria. Counseling and encouragement especially to be seeing a therapist regularly through [facility]. No change to medicine. Case reviewed with emergency room doctor and TTS.  Disposition: No evidence of imminent risk to self or others at present.   Supportive therapy provided about ongoing stressors.  Alethia Berthold, MD 10/30/2016 2:02 PM

## 2016-10-30 NOTE — ED Notes (Signed)
Pt discharged to lobby- wife in lobby to pick up patient.Pt was stable and appreciative at that time. All papers were given - no belongings to return- wife brought home at an earlier time. Verbal understanding expressed. Denies SI/HI and A/VH. Pt given opportunity to express concerns and ask questions.

## 2016-10-30 NOTE — ED Notes (Signed)
Patient easily aroused from sleep for breakfast and meds. Pt is calm and compliant with meds. Pt currently denies SI/HI, stating that he is "more calm right now". Pt complains that his mood typically swings 'back and forth', even when he is on his medications, and that when he gets mad he explodes. Pt reports that he is 'wanting to get his meds right'. Pt offered support and encouraged to ask questions.

## 2016-10-30 NOTE — ED Notes (Signed)
Lunch brought to patient. Pt watching tv in room with no behavioral issues.

## 2016-10-30 NOTE — ED Provider Notes (Signed)
-----------------------------------------   4:02 AM on 10/30/2016 -----------------------------------------   Blood pressure 130/81, pulse 93, temperature 98.5 F (36.9 C), temperature source Oral, resp. rate 18, height 6\' 5"  (1.956 m), weight 108.9 kg (240 lb), SpO2 95 %.  The patient had no acute events since last update.  Calm and cooperative at this time.  Disposition is pending Psychiatry/Behavioral Medicine team recommendations.     Willy Eddyobinson, Randy Castrejon, MD 10/30/16 [phone]

## 2016-10-30 NOTE — ED Provider Notes (Signed)
Patient is not suicidal or homicidal at this time, has been cleared for discharge by psychiatry.   Robert Barker, Jonathan E, MD 10/30/16 1320
